# Patient Record
Sex: Female | Born: 2000 | Race: Black or African American | Hispanic: No | Marital: Single | Smoking: Never smoker
Health system: Southern US, Community
[De-identification: ages and names within clinical notes are randomized; demographics above are authoritative.]

## PROBLEM LIST (undated history)

## (undated) DIAGNOSIS — Z789 Other specified health status: Secondary | ICD-10-CM

## (undated) HISTORY — PX: NO PAST SURGERIES: SHX2092

---
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
## 2020-01-27 ENCOUNTER — Ambulatory Visit: Payer: Self-pay | Admitting: Nurse Practitioner

## 2020-01-27 DIAGNOSIS — Z0289 Encounter for other administrative examinations: Secondary | ICD-10-CM

## 2020-03-29 ENCOUNTER — Ambulatory Visit: Payer: Self-pay | Admitting: Nurse Practitioner

## 2020-04-07 ENCOUNTER — Emergency Department (HOSPITAL_COMMUNITY)
Admission: EM | Admit: 2020-04-07 | Discharge: 2020-04-07 | Disposition: A | Discharge status: Home / Self Care | Payer: Self-pay | Attending: Emergency Medicine | Admitting: Emergency Medicine

## 2020-04-07 ENCOUNTER — Encounter (HOSPITAL_COMMUNITY): Payer: Self-pay | Admitting: *Deleted

## 2020-04-07 ENCOUNTER — Other Ambulatory Visit: Payer: Self-pay

## 2020-04-07 DIAGNOSIS — S61511A Laceration without foreign body of right wrist, initial encounter: Secondary | ICD-10-CM | POA: Insufficient documentation

## 2020-04-07 DIAGNOSIS — Z5321 Procedure and treatment not carried out due to patient leaving prior to being seen by health care provider: Secondary | ICD-10-CM | POA: Insufficient documentation

## 2020-04-07 NOTE — ED Notes (Signed)
Called pt multiple times no answer. Mother came in looking for pt and was unable to find as well
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
## 2020-04-08 ENCOUNTER — Ambulatory Visit: Payer: Self-pay | Admitting: Nurse Practitioner

## 2020-11-15 IMAGING — DX DG CHEST 2V
2 series · 2 of 2 positions shown · non-contrast
Comparison: 11/27/2003

CLINICAL DATA: Chest pain

EXAM:
CHEST - 2 VIEW

[chest pa]
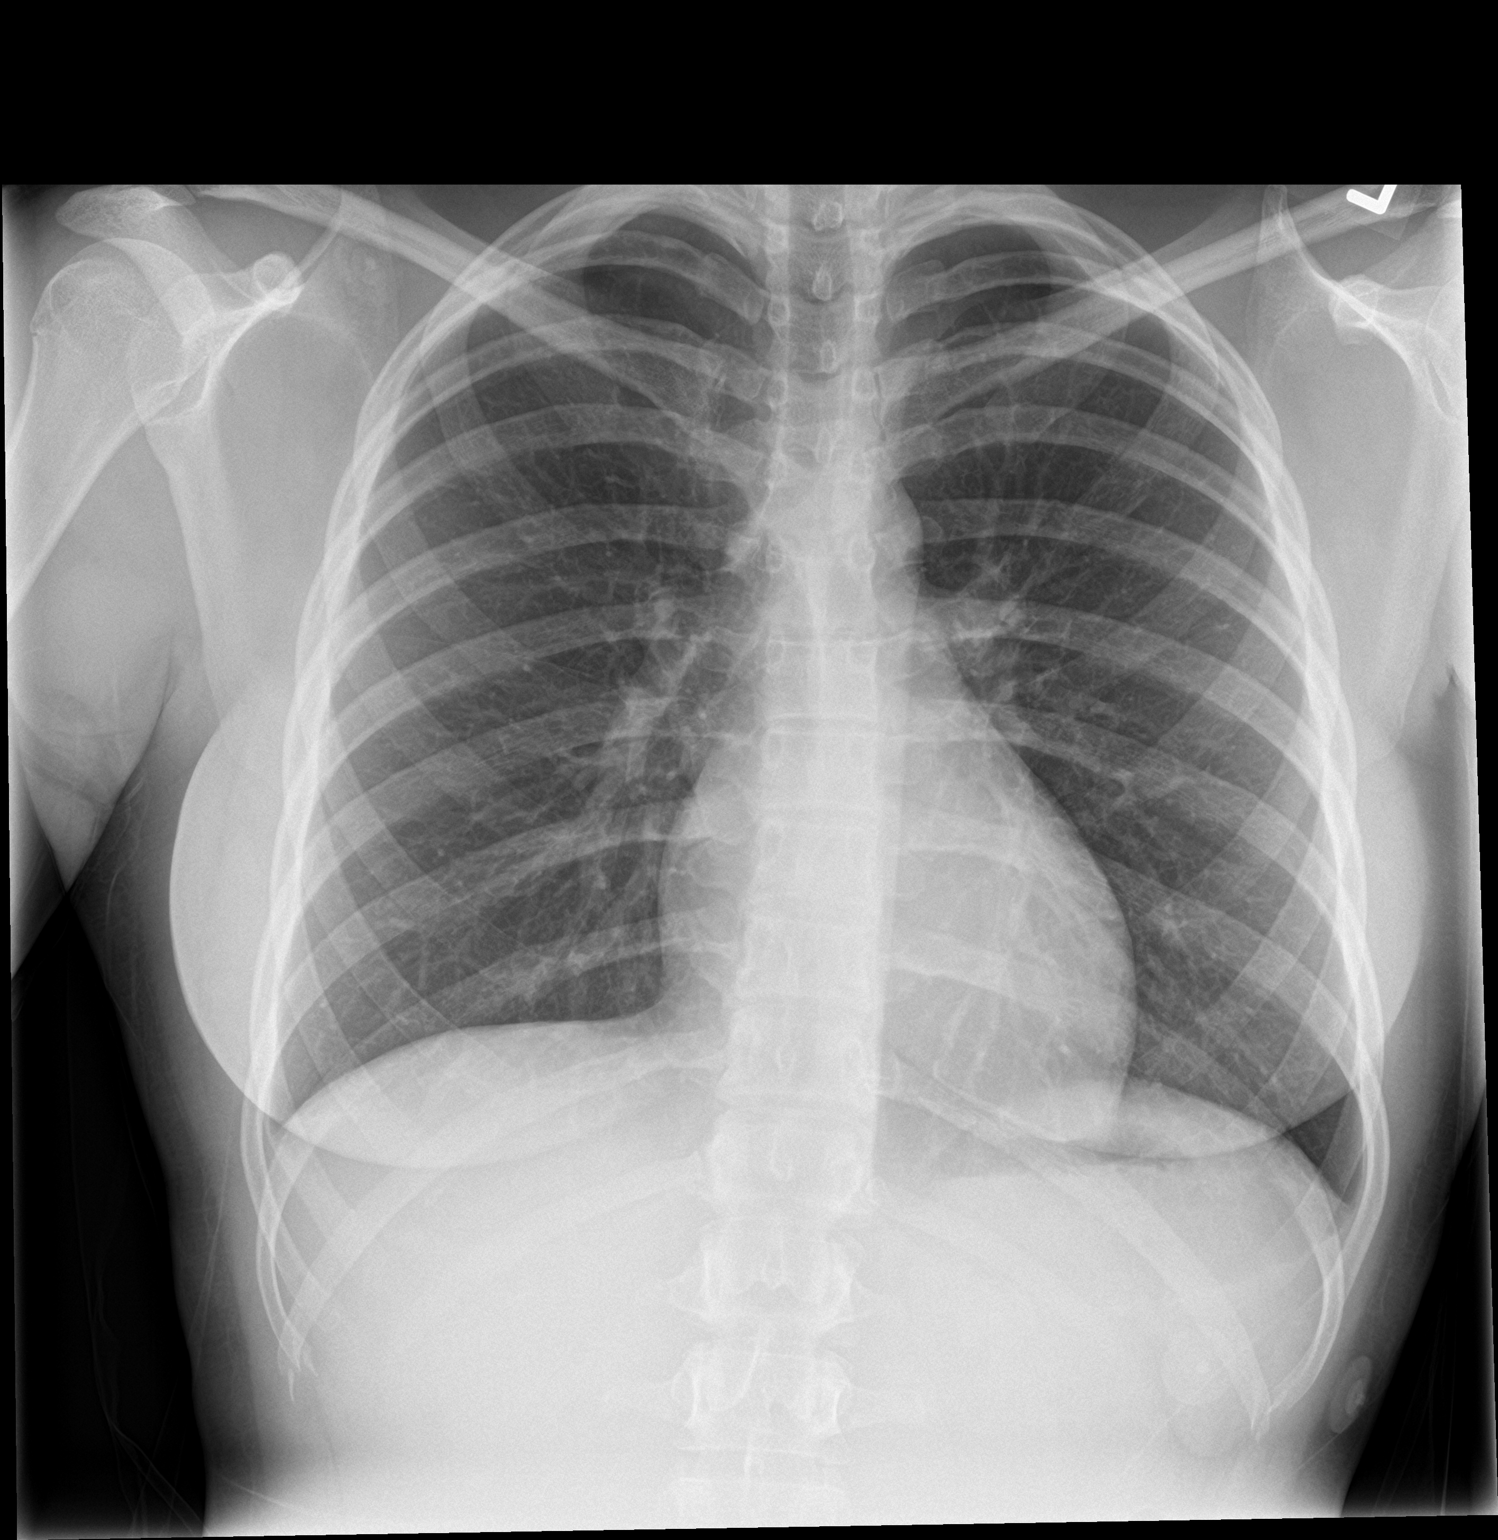

[chest lat]
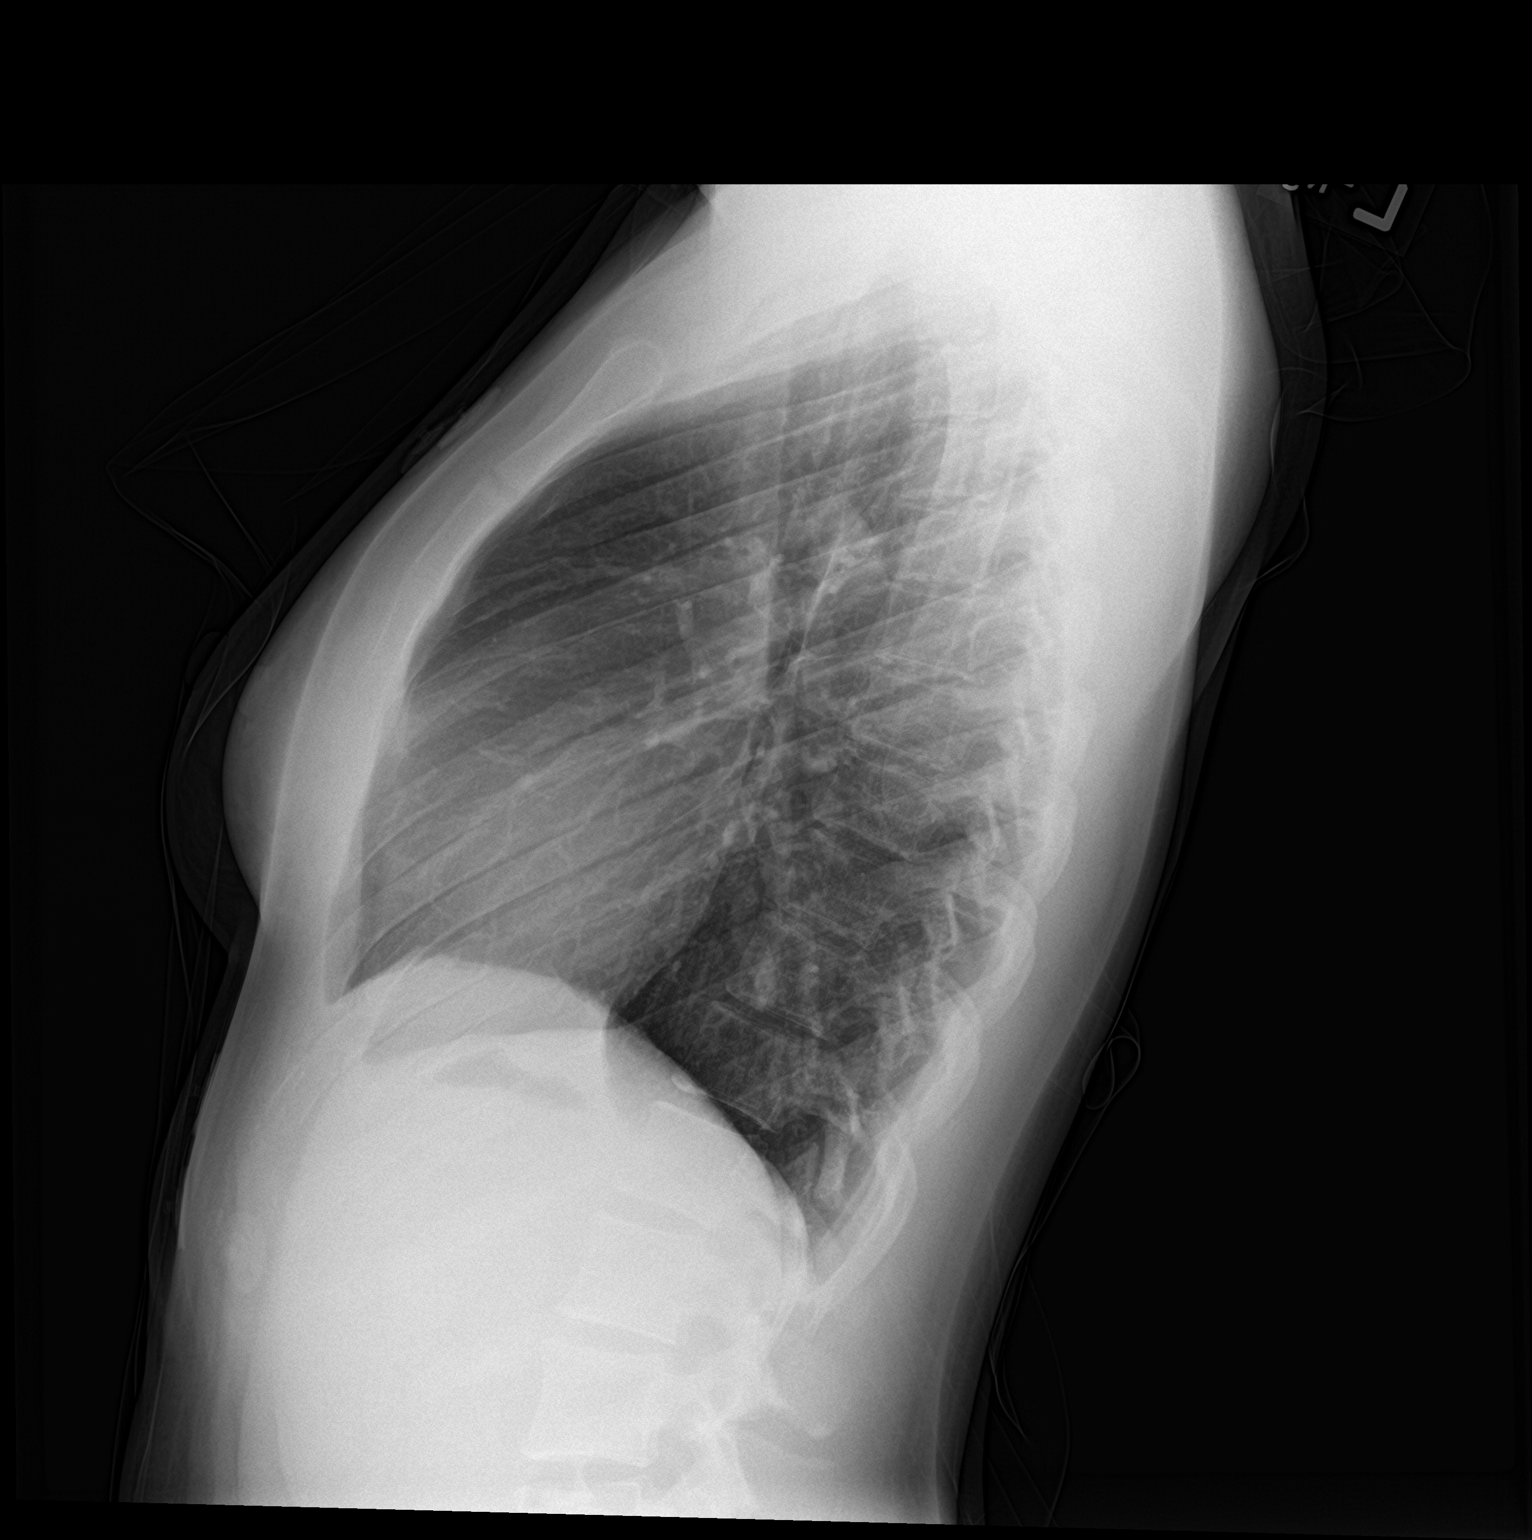

[2 of 2 positions shown; findings below may reference images not displayed]

FINDINGS: Normal heart size and mediastinal contours. No acute infiltrate or
edema. No effusion or pneumothorax. No acute osseous findings.
IMPRESSION: Negative chest.
